# Patient Record
Sex: Female | Born: 1985 | Race: White | Hispanic: No | Marital: Married | Smoking: Never smoker
Health system: Southern US, Community
[De-identification: ages and names within clinical notes are randomized; demographics above are authoritative.]

## PROBLEM LIST (undated history)

## (undated) DIAGNOSIS — Z789 Other specified health status: Secondary | ICD-10-CM

## (undated) HISTORY — PX: TYMPANOSTOMY TUBE PLACEMENT: SHX32

## (undated) HISTORY — PX: TUBAL LIGATION: SHX77

---
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
## 2006-07-14 ENCOUNTER — Emergency Department (HOSPITAL_COMMUNITY): Admission: EM | Admit: 2006-07-14 | Discharge: 2006-07-14 | Payer: Self-pay | Admitting: Emergency Medicine

## 2007-12-31 ENCOUNTER — Inpatient Hospital Stay (HOSPITAL_COMMUNITY): Admission: AD | Admit: 2007-12-31 | Discharge: 2007-12-31 | Payer: Self-pay | Admitting: Obstetrics & Gynecology

## 2008-01-11 ENCOUNTER — Inpatient Hospital Stay (HOSPITAL_COMMUNITY): Admission: RE | Admit: 2008-01-11 | Discharge: 2008-01-13 | Payer: Self-pay | Admitting: Obstetrics and Gynecology

## 2011-04-09 LAB — CBC
HCT: 35.8 — ABNORMAL LOW
Hemoglobin: 12.2
MCHC: 34.1
Platelets: 209
RBC: 3.93
RDW: 13.3
WBC: 8.9

## 2011-04-09 LAB — COMPREHENSIVE METABOLIC PANEL
ALT: 18
AST: 25
Albumin: 2.3 — ABNORMAL LOW
Calcium: 8.7
GFR calc Af Amer: >60
Potassium: 4
Sodium: 136
Total Protein: 5 — ABNORMAL LOW

## 2011-04-09 LAB — URINALYSIS, DIPSTICK ONLY
Bilirubin Urine: NEGATIVE
Glucose, UA: NEGATIVE
Hgb urine dipstick: NEGATIVE
Ketones, ur: NEGATIVE
Leukocytes, UA: NEGATIVE
pH: 7.5

## 2011-04-09 LAB — RPR: RPR Ser Ql: NONREACTIVE

## 2011-04-09 LAB — LACTATE DEHYDROGENASE: LDH: 158

## 2011-05-13 ENCOUNTER — Other Ambulatory Visit: Payer: Self-pay

## 2011-05-13 ENCOUNTER — Ambulatory Visit
Admission: RE | Admit: 2011-05-13 | Discharge: 2011-05-13 | Disposition: A | Discharge status: Home / Self Care | Payer: Commercial Managed Care - PPO | Source: Ambulatory Visit | Attending: Family Medicine | Admitting: Family Medicine

## 2011-05-13 ENCOUNTER — Other Ambulatory Visit: Payer: Self-pay | Admitting: Family Medicine

## 2011-05-13 DIAGNOSIS — R112 Nausea with vomiting, unspecified: Secondary | ICD-10-CM

## 2012-04-09 IMAGING — US US ABDOMEN COMPLETE
1 series · 14 of 25 positions shown · non-contrast
Comparison: None.

Were *RADIOLOGY REPORT*
CLINICAL DATA: Abdominal pain, nausea vomiting.

COMPLETE ABDOMINAL ULTRASOUND

[Series 1: us abdomen complete · 0.22mm/px · 14 of 77 slices shown]
[im 1/77]
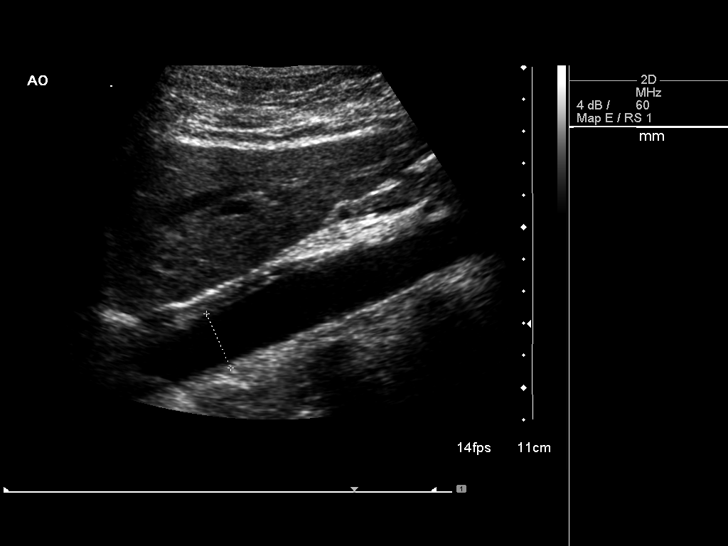
[im 7/77]
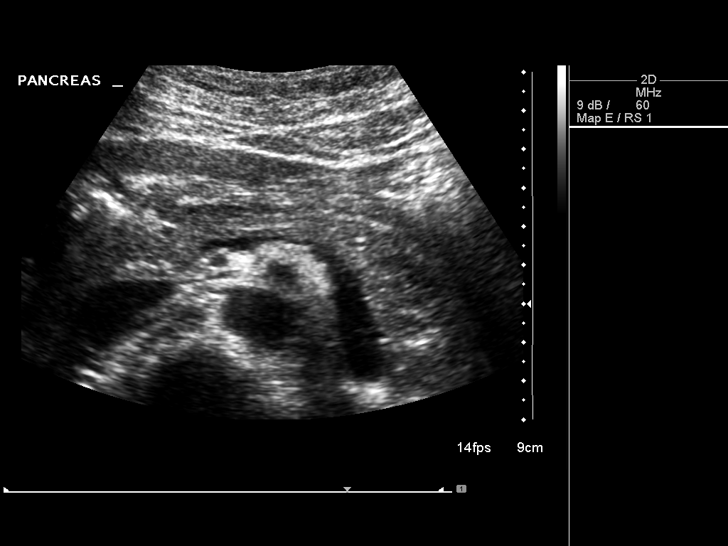
[im 13/77]
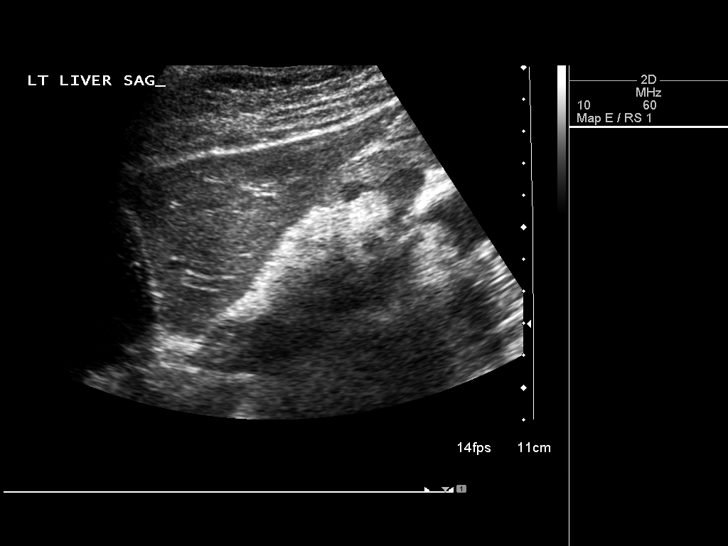
[im 20/77]
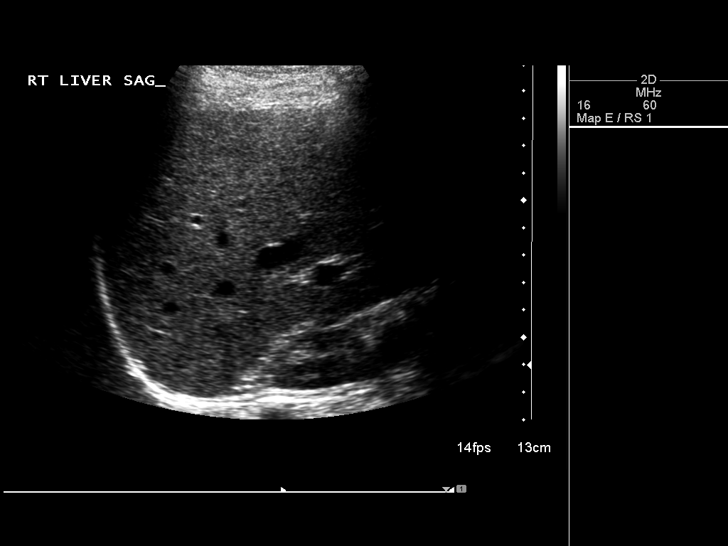
[im 26/77]
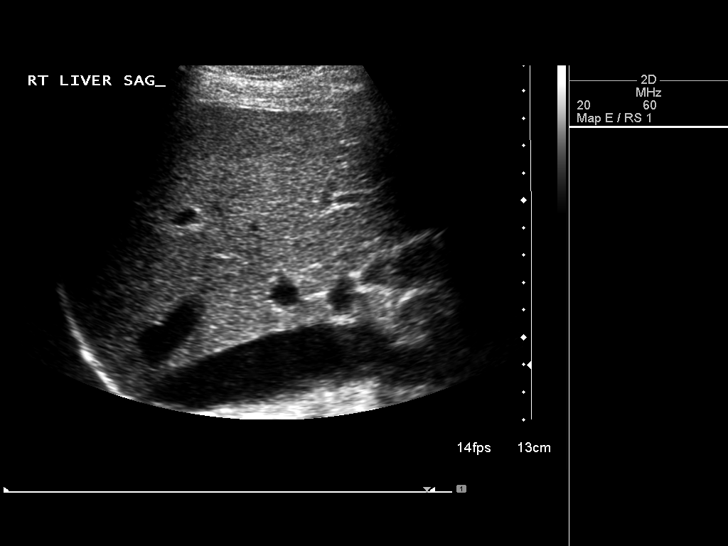
[im 29/77]
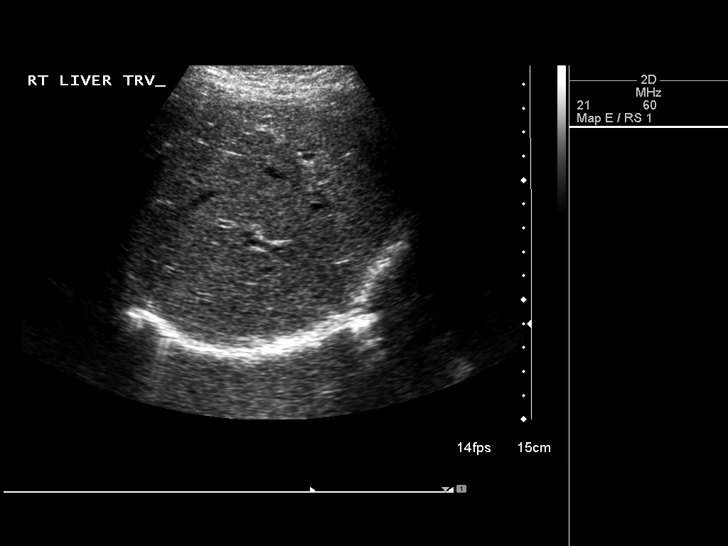
[im 35/77]
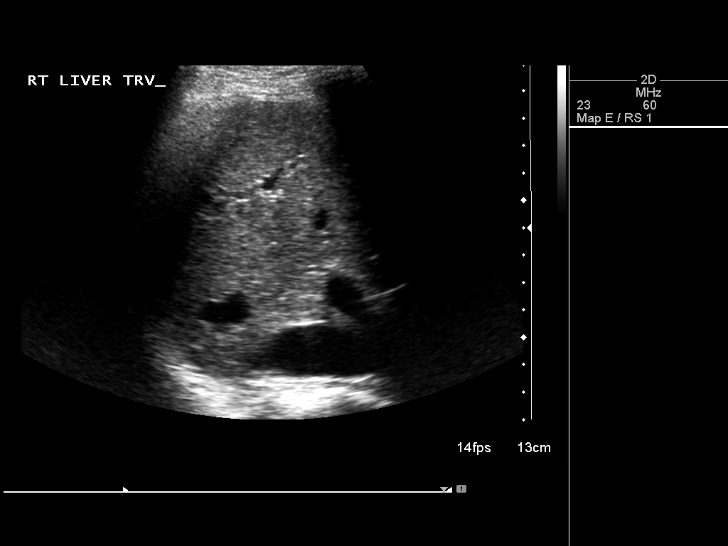
[im 42/77]
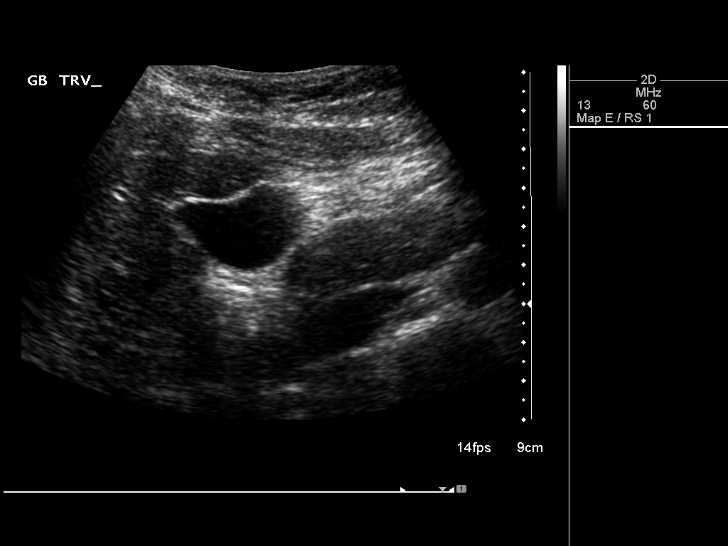
[im 48/77]
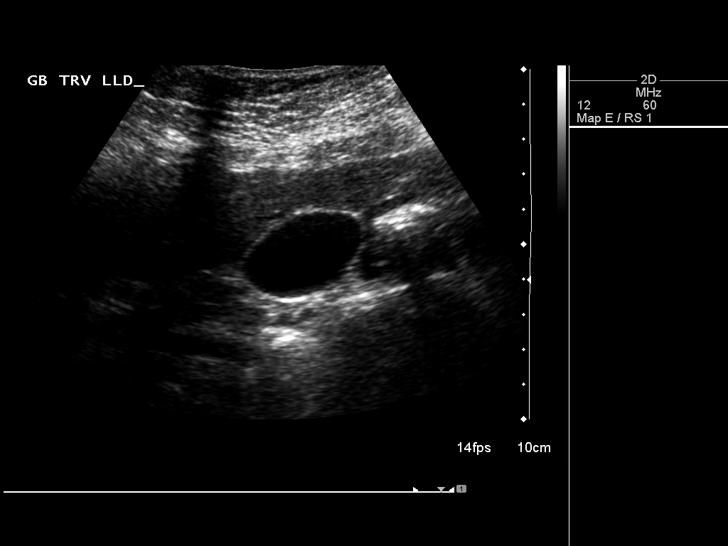
[im 51/77]
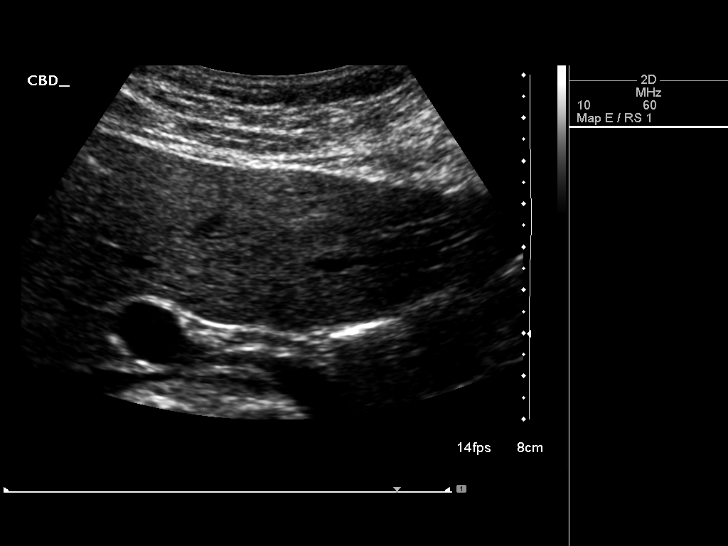
[im 58/77]
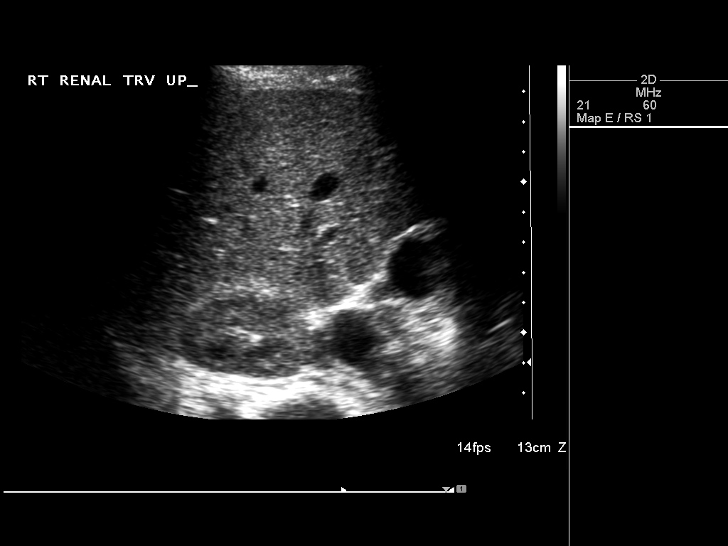
[im 64/77]
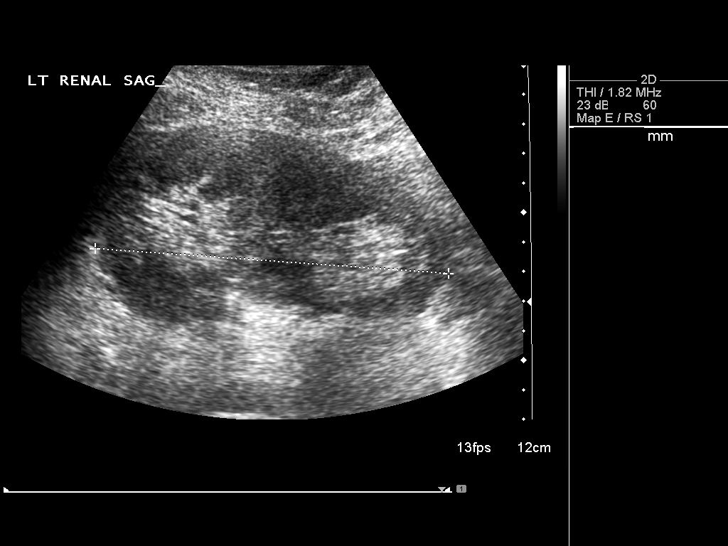
[im 70/77]
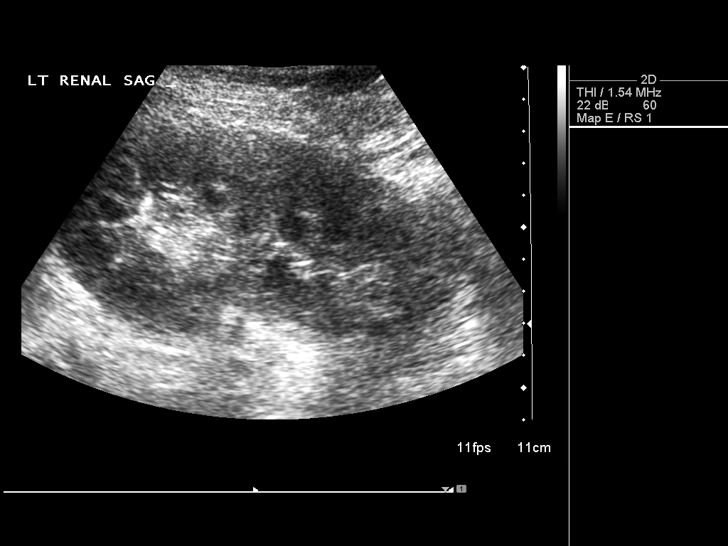
[im 77/77]
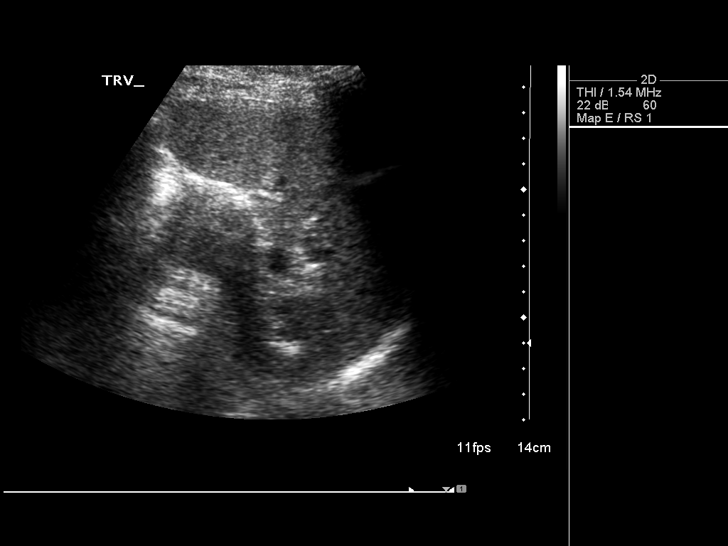

[14 of 25 positions shown; findings below may reference images not displayed]

FINDINGS: Gallbladder:  No gallstones, gallbladder wall thickening, or
pericholecystic fluid.

Common bile duct:  Normal in caliber measuring a maximum of 4.0mm.

Liver:  The liver is sonographically unremarkable.  There is normal
echogenicity without focal lesions or intrahepatic biliary
dilatation.

IVC:  Normal caliber.

Pancreas:  Sonographically unremarkable.

Spleen:  Normal size and echogenicity without focal lesions.

Right Kidney:  10.8 cm in length. Normal renal cortical thickness
and echogenicity without focal lesions or hydronephrosis.

Left Kidney:  12.4 cm in length. Normal renal cortical thickness
and echogenicity without focal lesions or hydronephrosis.

Abdominal aorta:  Normal caliber.
IMPRESSION: Unremarkable abdominal ultrasound examination.

## 2012-04-18 ENCOUNTER — Other Ambulatory Visit: Payer: Self-pay | Admitting: Family Medicine

## 2012-04-18 DIAGNOSIS — N63 Unspecified lump in unspecified breast: Secondary | ICD-10-CM

## 2012-04-20 ENCOUNTER — Other Ambulatory Visit: Payer: Commercial Managed Care - PPO

## 2012-04-27 ENCOUNTER — Ambulatory Visit
Admission: RE | Admit: 2012-04-27 | Discharge: 2012-04-27 | Disposition: A | Discharge status: Home / Self Care | Payer: Commercial Managed Care - PPO | Source: Ambulatory Visit | Attending: Family Medicine | Admitting: Family Medicine

## 2012-04-27 DIAGNOSIS — N63 Unspecified lump in unspecified breast: Secondary | ICD-10-CM

## 2013-06-06 ENCOUNTER — Ambulatory Visit (HOSPITAL_COMMUNITY)
Admission: RE | Admit: 2013-06-06 | Discharge: 2013-06-06 | Disposition: A | Discharge status: Home / Self Care | Payer: 59 | Source: Ambulatory Visit | Attending: Pediatrics | Admitting: Pediatrics

## 2013-06-06 DIAGNOSIS — O0281 Inappropriate change in quantitative human chorionic gonadotropin (hCG) in early pregnancy: Secondary | ICD-10-CM | POA: Insufficient documentation

## 2013-06-06 LAB — HCG, QUANTITATIVE, PREGNANCY: hCG, Beta Chain, Quant, S: 5852 m[IU]/mL — ABNORMAL HIGH (ref <5)

## 2013-07-13 NOTE — L&D Delivery Note (Signed)
Delivery Note At 3:43 PM a viable and healthy female was delivered via Vaginal, Spontaneous Delivery (Presentation: Left Occiput Anterior).  APGAR: 9, 9; weight  pending.   Placenta status: Intact, Spontaneous.  Cord: 3 vessels   Anesthesia: Epidural  Episiotomy: None Lacerations: 2nd degree;Perineal Suture Repair: 3.0 vicryl Est. Blood Loss (mL):  400 cc  Mom to postpartum.  Baby to Couplet care / Skin to Skin.  Huntley Knoop H. 06/11/2014, 4:06 PM

## 2013-09-21 ENCOUNTER — Encounter: Payer: Self-pay | Admitting: Podiatry

## 2013-09-21 ENCOUNTER — Ambulatory Visit (INDEPENDENT_AMBULATORY_CARE_PROVIDER_SITE_OTHER): Payer: 59

## 2013-09-21 ENCOUNTER — Ambulatory Visit (INDEPENDENT_AMBULATORY_CARE_PROVIDER_SITE_OTHER): Payer: 59 | Admitting: Podiatry

## 2013-09-21 VITALS — BP 109/76 | HR 108 | Resp 16 | Ht 65.0 in | Wt 153.0 lb

## 2013-09-21 DIAGNOSIS — L03039 Cellulitis of unspecified toe: Secondary | ICD-10-CM

## 2013-09-21 DIAGNOSIS — M722 Plantar fascial fibromatosis: Secondary | ICD-10-CM

## 2013-09-21 MED ORDER — TRIAMCINOLONE ACETONIDE 10 MG/ML IJ SUSP
10.0000 mg | Freq: Once | INTRAMUSCULAR | Status: AC
  Administered 2013-09-21: 10 mg

## 2013-09-21 NOTE — Patient Instructions (Signed)
Betadine Soak Instructions  Purchase an 8 oz. bottle of BETADINE solution (Povidone)  THE DAY AFTER THE PROCEDURE  Place 1 tablespoon of betadine solution in a quart of warm tap water.  Submerge your foot or feet with outer bandage intact for the initial soak; this will allow the bandage to become moist and wet for easy lift off.  Once you remove your bandage, continue to soak in the solution for 20 minutes.  This soak should be done twice a day.  Next, remove your foot or feet from solution, blot dry the affected area and cover.  You may use a band aid large enough to cover the area or use gauze and tape.  Apply other medications to the area as directed by the doctor such as cortisporin otic solution (ear drops) or neosporin.  IF YOUR SKIN BECOMES IRRITATED WHILE USING THESE INSTRUCTIONS, IT IS OKAY TO SWITCH TO EPSOM SALTS AND WATER OR WHITE VINEGAR AND WATER.   Long Term Care Instructions-Post Nail Surgery  You have had your ingrown toenail and root treated with a chemical.  This chemical causes a burn that will drain and ooze like a blister.  This can drain for 6-8 weeks or longer.  It is important to keep this area clean, covered, and follow the soaking instructions dispensed at the time of your surgery.  This area will eventually dry and form a scab.  Once the scab forms you no longer need to soak or apply a dressing.  If at any time you experience an increase in pain, redness, swelling, or drainage, you should contact the office as soon as possible.    Plantar Fasciitis (Heel Spur Syndrome) with Rehab The plantar fascia is a fibrous, ligament-like, soft-tissue structure that spans the bottom of the foot. Plantar fasciitis is a condition that causes pain in the foot due to inflammation of the tissue. SYMPTOMS   Pain and tenderness on the underneath side of the foot.  Pain that worsens with standing or walking. CAUSES  Plantar fasciitis is caused by irritation and injury to the plantar  fascia on the underneath side of the foot. Common mechanisms of injury include:  Direct trauma to bottom of the foot.  Damage to a small nerve that runs under the foot where the main fascia attaches to the heel bone.  Stress placed on the plantar fascia due to bone spurs. RISK INCREASES WITH:   Activities that place stress on the plantar fascia (running, jumping, pivoting, or cutting).  Poor strength and flexibility.  Improperly fitted shoes.  Tight calf muscles.  Flat feet.  Failure to warm-up properly before activity.  Obesity. PREVENTION  Warm up and stretch properly before activity.  Allow for adequate recovery between workouts.  Maintain physical fitness:  Strength, flexibility, and endurance.  Cardiovascular fitness.  Maintain a health body weight.  Avoid stress on the plantar fascia.  Wear properly fitted shoes, including arch supports for individuals who have flat feet.  PROGNOSIS  If treated properly, then the symptoms of plantar fasciitis usually resolve without surgery. However, occasionally surgery is necessary.  RELATED COMPLICATIONS   Recurrent symptoms that may result in a chronic condition.  Problems of the lower back that are caused by compensating for the injury, such as limping.  Pain or weakness of the foot during push-off following surgery.  Chronic inflammation, scarring, and partial or complete fascia tear, occurring more often from repeated injections.  TREATMENT  Treatment initially involves the use of ice and medication to   help reduce pain and inflammation. The use of strengthening and stretching exercises may help reduce pain with activity, especially stretches of the Achilles tendon. These exercises may be performed at home or with a therapist. Your caregiver may recommend that you use heel cups of arch supports to help reduce stress on the plantar fascia. Occasionally, corticosteroid injections are given to reduce inflammation. If  symptoms persist for greater than 6 months despite non-surgical (conservative), then surgery may be recommended.   MEDICATION   If pain medication is necessary, then nonsteroidal anti-inflammatory medications, such as aspirin and ibuprofen, or other minor pain relievers, such as acetaminophen, are often recommended.  Do not take pain medication within 7 days before surgery.  Prescription pain relievers may be given if deemed necessary by your caregiver. Use only as directed and only as much as you need.  Corticosteroid injections may be given by your caregiver. These injections should be reserved for the most serious cases, because they may only be given a certain number of times.  HEAT AND COLD  Cold treatment (icing) relieves pain and reduces inflammation. Cold treatment should be applied for 10 to 15 minutes every 2 to 3 hours for inflammation and pain and immediately after any activity that aggravates your symptoms. Use ice packs or massage the area with a piece of ice (ice massage).  Heat treatment may be used prior to performing the stretching and strengthening activities prescribed by your caregiver, physical therapist, or athletic trainer. Use a heat pack or soak the injury in warm water.  SEEK IMMEDIATE MEDICAL CARE IF:  Treatment seems to offer no benefit, or the condition worsens.  Any medications produce adverse side effects.  EXERCISES- RANGE OF MOTION (ROM) AND STRETCHING EXERCISES - Plantar Fasciitis (Heel Spur Syndrome) These exercises may help you when beginning to rehabilitate your injury. Your symptoms may resolve with or without further involvement from your physician, physical therapist or athletic trainer. While completing these exercises, remember:   Restoring tissue flexibility helps normal motion to return to the joints. This allows healthier, less painful movement and activity.  An effective stretch should be held for at least 30 seconds.  A stretch should  never be painful. You should only feel a gentle lengthening or release in the stretched tissue.  RANGE OF MOTION - Toe Extension, Flexion  Sit with your right / left leg crossed over your opposite knee.  Grasp your toes and gently pull them back toward the top of your foot. You should feel a stretch on the bottom of your toes and/or foot.  Hold this stretch for 10 seconds.  Now, gently pull your toes toward the bottom of your foot. You should feel a stretch on the top of your toes and or foot.  Hold this stretch for 10 seconds. Repeat  times. Complete this stretch 3 times per day.   RANGE OF MOTION - Ankle Dorsiflexion, Active Assisted  Remove shoes and sit on a chair that is preferably not on a carpeted surface.  Place right / left foot under knee. Extend your opposite leg for support.  Keeping your heel down, slide your right / left foot back toward the chair until you feel a stretch at your ankle or calf. If you do not feel a stretch, slide your bottom forward to the edge of the chair, while still keeping your heel down.  Hold this stretch for 10 seconds. Repeat 3 times. Complete this stretch 2 times per day.   STRETCH  Gastroc, Standing    Place hands on wall.  Extend right / left leg, keeping the front knee somewhat bent.  Slightly point your toes inward on your back foot.  Keeping your right / left heel on the floor and your knee straight, shift your weight toward the wall, not allowing your back to arch.  You should feel a gentle stretch in the right / left calf. Hold this position for 10 seconds. Repeat 3 times. Complete this stretch 2 times per day.  STRETCH  Soleus, Standing  Place hands on wall.  Extend right / left leg, keeping the other knee somewhat bent.  Slightly point your toes inward on your back foot.  Keep your right / left heel on the floor, bend your back knee, and slightly shift your weight over the back leg so that you feel a gentle stretch deep in  your back calf.  Hold this position for 10 seconds. Repeat 3 times. Complete this stretch 2 times per day.  STRETCH  Gastrocsoleus, Standing  Note: This exercise can place a lot of stress on your foot and ankle. Please complete this exercise only if specifically instructed by your caregiver.   Place the ball of your right / left foot on a step, keeping your other foot firmly on the same step.  Hold on to the wall or a rail for balance.  Slowly lift your other foot, allowing your body weight to press your heel down over the edge of the step.  You should feel a stretch in your right / left calf.  Hold this position for 10 seconds.  Repeat this exercise with a slight bend in your right / left knee. Repeat 3 times. Complete this stretch 2 times per day.   STRENGTHENING EXERCISES - Plantar Fasciitis (Heel Spur Syndrome)  These exercises may help you when beginning to rehabilitate your injury. They may resolve your symptoms with or without further involvement from your physician, physical therapist or athletic trainer. While completing these exercises, remember:   Muscles can gain both the endurance and the strength needed for everyday activities through controlled exercises.  Complete these exercises as instructed by your physician, physical therapist or athletic trainer. Progress the resistance and repetitions only as guided.  STRENGTH - Towel Curls  Sit in a chair positioned on a non-carpeted surface.  Place your foot on a towel, keeping your heel on the floor.  Pull the towel toward your heel by only curling your toes. Keep your heel on the floor. Repeat 3 times. Complete this exercise 2 times per day.  STRENGTH - Ankle Inversion  Secure one end of a rubber exercise band/tubing to a fixed object (table, pole). Loop the other end around your foot just before your toes.  Place your fists between your knees. This will focus your strengthening at your ankle.  Slowly, pull your  big toe up and in, making sure the band/tubing is positioned to resist the entire motion.  Hold this position for 10 seconds.  Have your muscles resist the band/tubing as it slowly pulls your foot back to the starting position. Repeat 3 times. Complete this exercises 2 times per day.  Document Released: 06/29/2005 Document Revised: 09/21/2011 Document Reviewed: 10/11/2008 [employer] Patient Information 2014 [employer].   

## 2013-09-21 NOTE — Progress Notes (Signed)
   Subjective:    Patient ID: Jaime Frank, female    DOB: 02/11/1986, 28 y.o.   MRN: [id number]  HPI Comments: "I have some toenail problems and my heel hurts"  Patient c/o 1st toes bilateral, right-lateral border and left-medial border for a few weeks. The area are red and swollen. Right worse than left. She has been soaking in epsom and using neosporin. She also started back on a leftover antibiotic she had. Some better.  Also, patient c/o aching plantar heel left for a few months. Does have AM pain. Dankso clogs make worse. She has been trying sneakers.  Foot Pain      Review of Systems  All other systems reviewed and are negative.       Objective:   Physical Exam        Assessment & Plan:

## 2013-09-23 NOTE — Progress Notes (Signed)
Subjective:     Patient ID: Jaime Frank, female   DOB: 04/19/1986, 28 y.o.   MRN: [id number]  Foot Pain   patient presents with pain in the left plantar fascia that has been going on for several months and he rotated nailbed right lateral left medial that had been red and sore when pressed with no history of injury or tight shoes   Review of Systems  All other systems reviewed and are negative.       Objective:   Physical Exam  Nursing note and vitals reviewed. Constitutional: She is oriented to person, place, and time.  Cardiovascular: Intact distal pulses.   Musculoskeletal: Normal range of motion.  Neurological: She is oriented to person, place, and time.  Skin: Skin is warm.   neurovascular status intact with muscle strength adequate and no equinus and range of motion within normal limits. Discomfort of a significant nature plantar heel left at the insertional point of the tendon into the calcaneus and redness of the localized nature right lateral border left medial border with soreness when pressed     Assessment:     Paronychia infection of each big toe and plantar fascial pain left    Plan:     H&P and x-rays reviewed and recommended removal of the nail corners. I infiltrated each hallux 60 mg Xylocaine Marcaine mixture remove the corners and proud flesh and allowed drainage to occur and then applied sterile dressing. Injected the left plantar fascia 3 a Kenalog 5 mg Xylocaine Marcaine mixture and applied fascially brace for support and gave instructions on reduced activity for the next several days

## 2013-10-05 ENCOUNTER — Ambulatory Visit: Payer: 59 | Admitting: Podiatry

## 2013-12-05 LAB — OB RESULTS CONSOLE GC/CHLAMYDIA
Chlamydia: NEGATIVE
GC PROBE AMP, GENITAL: NEGATIVE

## 2013-12-05 LAB — OB RESULTS CONSOLE ABO/RH: RH TYPE: POSITIVE

## 2013-12-05 LAB — OB RESULTS CONSOLE ANTIBODY SCREEN: ANTIBODY SCREEN: NEGATIVE

## 2013-12-05 LAB — OB RESULTS CONSOLE HIV ANTIBODY (ROUTINE TESTING): HIV: NONREACTIVE

## 2013-12-05 LAB — OB RESULTS CONSOLE RPR: RPR: NONREACTIVE

## 2013-12-05 LAB — OB RESULTS CONSOLE RUBELLA ANTIBODY, IGM: RUBELLA: IMMUNE

## 2013-12-05 LAB — OB RESULTS CONSOLE HEPATITIS B SURFACE ANTIGEN: Hepatitis B Surface Ag: NEGATIVE

## 2014-05-21 LAB — OB RESULTS CONSOLE GBS: GBS: NEGATIVE

## 2014-06-11 ENCOUNTER — Encounter (HOSPITAL_COMMUNITY): Payer: Self-pay

## 2014-06-11 ENCOUNTER — Inpatient Hospital Stay (HOSPITAL_COMMUNITY): Payer: 59 | Admitting: Anesthesiology

## 2014-06-11 ENCOUNTER — Inpatient Hospital Stay (HOSPITAL_COMMUNITY)
Admission: AD | Admit: 2014-06-11 | Discharge: 2014-06-13 | DRG: 775 | Disposition: A | Discharge status: Home / Self Care | Payer: 59 | Source: Ambulatory Visit | Attending: Obstetrics and Gynecology | Admitting: Obstetrics and Gynecology

## 2014-06-11 ENCOUNTER — Encounter (HOSPITAL_COMMUNITY): Payer: Self-pay | Admitting: *Deleted

## 2014-06-11 DIAGNOSIS — Z3A39 39 weeks gestation of pregnancy: Secondary | ICD-10-CM | POA: Diagnosis present

## 2014-06-11 DIAGNOSIS — Z3483 Encounter for supervision of other normal pregnancy, third trimester: Secondary | ICD-10-CM | POA: Diagnosis present

## 2014-06-11 DIAGNOSIS — Z331 Pregnant state, incidental: Secondary | ICD-10-CM

## 2014-06-11 HISTORY — DX: Other specified health status: Z78.9

## 2014-06-11 LAB — TYPE AND SCREEN
ABO/RH(D): A POS
Antibody Screen: NEGATIVE

## 2014-06-11 LAB — CBC
HCT: 37.2 % (ref 36.0–46.0)
Hemoglobin: 12.2 g/dL (ref 12.0–15.0)
MCH: 29 pg (ref 26.0–34.0)
MCHC: 32.8 g/dL (ref 30.0–36.0)
MCV: 88.4 fL (ref 78.0–100.0)
PLATELETS: 206 10*3/uL (ref 150–400)
RBC: 4.21 MIL/uL (ref 3.87–5.11)
RDW: 13.6 % (ref 11.5–15.5)
WBC: 10.2 10*3/uL (ref 4.0–10.5)

## 2014-06-11 LAB — RPR

## 2014-06-11 MED ORDER — OXYTOCIN 40 UNITS IN LACTATED RINGERS INFUSION - SIMPLE MED: 62.5000 mL/h | INTRAVENOUS | Status: DC

## 2014-06-11 MED ORDER — SENNOSIDES-DOCUSATE SODIUM 8.6-50 MG PO TABS
2.0000 | ORAL_TABLET | ORAL | Status: DC
  Administered 2014-06-11 – 2014-06-12 (×2): 2 via ORAL
  Filled 2014-06-11 (×2): qty 2

## 2014-06-11 MED ORDER — DIPHENHYDRAMINE HCL 25 MG PO CAPS: 25.0000 mg | ORAL_CAPSULE | Freq: Four times a day (QID) | ORAL | Status: DC | PRN

## 2014-06-11 MED ORDER — METHYLERGONOVINE MALEATE 0.2 MG PO TABS: 0.2000 mg | ORAL_TABLET | ORAL | Status: DC | PRN

## 2014-06-11 MED ORDER — OXYCODONE-ACETAMINOPHEN 5-325 MG PO TABS: 2.0000 | ORAL_TABLET | ORAL | Status: DC | PRN

## 2014-06-11 MED ORDER — EPHEDRINE 5 MG/ML INJ
10.0000 mg | INTRAVENOUS | Status: DC | PRN
  Filled 2014-06-11: qty 2

## 2014-06-11 MED ORDER — TETANUS-DIPHTH-ACELL PERTUSSIS 5-2.5-18.5 LF-MCG/0.5 IM SUSP
0.5000 mL | Freq: Once | INTRAMUSCULAR | Status: AC
  Administered 2014-06-12: 0.5 mL via INTRAMUSCULAR
  Filled 2014-06-11: qty 0.5

## 2014-06-11 MED ORDER — OXYCODONE-ACETAMINOPHEN 5-325 MG PO TABS: 1.0000 | ORAL_TABLET | ORAL | Status: DC | PRN

## 2014-06-11 MED ORDER — DIBUCAINE 1 % RE OINT: 1.0000 "application " | TOPICAL_OINTMENT | RECTAL | Status: DC | PRN

## 2014-06-11 MED ORDER — FENTANYL 2.5 MCG/ML BUPIVACAINE 1/10 % EPIDURAL INFUSION (WH - ANES)
14.0000 mL/h | INTRAMUSCULAR | Status: DC | PRN
  Administered 2014-06-11: 14 mL/h via EPIDURAL

## 2014-06-11 MED ORDER — PHENYLEPHRINE 40 MCG/ML (10ML) SYRINGE FOR IV PUSH (FOR BLOOD PRESSURE SUPPORT)
80.0000 ug | PREFILLED_SYRINGE | INTRAVENOUS | Status: DC | PRN
  Filled 2014-06-11: qty 2

## 2014-06-11 MED ORDER — FENTANYL 2.5 MCG/ML BUPIVACAINE 1/10 % EPIDURAL INFUSION (WH - ANES): 14.0000 mL/h | INTRAMUSCULAR | Status: DC | PRN

## 2014-06-11 MED ORDER — PHENYLEPHRINE 40 MCG/ML (10ML) SYRINGE FOR IV PUSH (FOR BLOOD PRESSURE SUPPORT)
PREFILLED_SYRINGE | INTRAVENOUS | Status: AC
  Filled 2014-06-11: qty 10

## 2014-06-11 MED ORDER — FLEET ENEMA 7-19 GM/118ML RE ENEM: 1.0000 | ENEMA | Freq: Every day | RECTAL | Status: DC | PRN

## 2014-06-11 MED ORDER — ONDANSETRON HCL 4 MG PO TABS: 4.0000 mg | ORAL_TABLET | ORAL | Status: DC | PRN

## 2014-06-11 MED ORDER — CITRIC ACID-SODIUM CITRATE 334-500 MG/5ML PO SOLN: 30.0000 mL | ORAL | Status: DC | PRN

## 2014-06-11 MED ORDER — LACTATED RINGERS IV SOLN
500.0000 mL | INTRAVENOUS | Status: DC | PRN
  Administered 2014-06-11: 500 mL via INTRAVENOUS

## 2014-06-11 MED ORDER — PRENATAL MULTIVITAMIN CH
1.0000 | ORAL_TABLET | Freq: Every day | ORAL | Status: DC
  Administered 2014-06-12 – 2014-06-13 (×2): 1 via ORAL
  Filled 2014-06-11 (×2): qty 1

## 2014-06-11 MED ORDER — ONDANSETRON HCL 4 MG/2ML IJ SOLN: 4.0000 mg | INTRAMUSCULAR | Status: DC | PRN

## 2014-06-11 MED ORDER — WITCH HAZEL-GLYCERIN EX PADS: 1.0000 "application " | MEDICATED_PAD | CUTANEOUS | Status: DC | PRN

## 2014-06-11 MED ORDER — OXYTOCIN 40 UNITS IN LACTATED RINGERS INFUSION - SIMPLE MED
1.0000 m[IU]/min | INTRAVENOUS | Status: DC
  Administered 2014-06-11: 2 m[IU]/min via INTRAVENOUS

## 2014-06-11 MED ORDER — OXYCODONE-ACETAMINOPHEN 5-325 MG PO TABS
1.0000 | ORAL_TABLET | ORAL | Status: DC | PRN
  Administered 2014-06-12: 1 via ORAL
  Filled 2014-06-11: qty 1

## 2014-06-11 MED ORDER — TERBUTALINE SULFATE 1 MG/ML IJ SOLN: 0.2500 mg | Freq: Once | INTRAMUSCULAR | Status: DC | PRN

## 2014-06-11 MED ORDER — OXYTOCIN BOLUS FROM INFUSION: 500.0000 mL | INTRAVENOUS | Status: DC

## 2014-06-11 MED ORDER — LACTATED RINGERS IV SOLN
500.0000 mL | Freq: Once | INTRAVENOUS | Status: AC
  Administered 2014-06-11: 500 mL via INTRAVENOUS

## 2014-06-11 MED ORDER — LANOLIN HYDROUS EX OINT: TOPICAL_OINTMENT | CUTANEOUS | Status: DC | PRN

## 2014-06-11 MED ORDER — DIPHENHYDRAMINE HCL 50 MG/ML IJ SOLN: 12.5000 mg | INTRAMUSCULAR | Status: DC | PRN

## 2014-06-11 MED ORDER — BENZOCAINE-MENTHOL 20-0.5 % EX AERO
1.0000 "application " | INHALATION_SPRAY | CUTANEOUS | Status: DC | PRN
  Administered 2014-06-11: 1 via TOPICAL
  Filled 2014-06-11: qty 56

## 2014-06-11 MED ORDER — IBUPROFEN 600 MG PO TABS
600.0000 mg | ORAL_TABLET | Freq: Four times a day (QID) | ORAL | Status: DC
  Administered 2014-06-11 – 2014-06-13 (×8): 600 mg via ORAL
  Filled 2014-06-11 (×8): qty 1

## 2014-06-11 MED ORDER — LIDOCAINE HCL (PF) 1 % IJ SOLN
30.0000 mL | INTRAMUSCULAR | Status: DC | PRN
  Filled 2014-06-11: qty 30

## 2014-06-11 MED ORDER — SIMETHICONE 80 MG PO CHEW: 80.0000 mg | CHEWABLE_TABLET | ORAL | Status: DC | PRN

## 2014-06-11 MED ORDER — ZOLPIDEM TARTRATE 5 MG PO TABS: 5.0000 mg | ORAL_TABLET | Freq: Every evening | ORAL | Status: DC | PRN

## 2014-06-11 MED ORDER — LACTATED RINGERS IV SOLN
INTRAVENOUS | Status: DC
  Administered 2014-06-11: 12:00:00 via INTRAVENOUS

## 2014-06-11 MED ORDER — FENTANYL 2.5 MCG/ML BUPIVACAINE 1/10 % EPIDURAL INFUSION (WH - ANES)
INTRAMUSCULAR | Status: DC
Start: 2014-06-11 — End: 2014-06-11
  Filled 2014-06-11: qty 125

## 2014-06-11 MED ORDER — ACETAMINOPHEN 325 MG PO TABS: 650.0000 mg | ORAL_TABLET | ORAL | Status: DC | PRN

## 2014-06-11 MED ORDER — LIDOCAINE HCL (PF) 1 % IJ SOLN
INTRAMUSCULAR | Status: DC | PRN
  Administered 2014-06-11: 6 mL
  Administered 2014-06-11: 4 mL

## 2014-06-11 MED ORDER — METHYLERGONOVINE MALEATE 0.2 MG/ML IJ SOLN: 0.2000 mg | INTRAMUSCULAR | Status: DC | PRN

## 2014-06-11 MED ORDER — ONDANSETRON HCL 4 MG/2ML IJ SOLN: 4.0000 mg | Freq: Four times a day (QID) | INTRAMUSCULAR | Status: DC | PRN

## 2014-06-11 NOTE — Anesthesia Preprocedure Evaluation (Signed)

## 2014-06-11 NOTE — Anesthesia Procedure Notes (Signed)

## 2014-06-11 NOTE — H&P (Signed)
Jaime MeadJessica Erler is a 28 y.o. female presenting for induction of labor  7127 you G2P1001 @ 39+4 presents for elective induction pf labor. He has been uncomplicated to this point  History OB History    Gravida Para Term Preterm AB TAB SAB Ectopic Multiple Living   2 1 1       1      Past Medical History  Diagnosis Date  . Medical history non-contributory    Past Surgical History  Procedure Laterality Date  . No past surgeries     Family History: family history is not on file. Social History:  reports that she has never smoked. She has never used smokeless tobacco. She reports that she does not drink alcohol or use illicit drugs.   Prenatal Transfer Tool  Maternal Diabetes: No 1 hour Glucola 78 Genetic Screening: Declined Maternal Ultrasounds/Referrals: Normal Fetal Ultrasounds or other Referrals:  None Maternal Substance Abuse:  No Significant Maternal Medications:  None Significant Maternal Lab Results:  None Other Comments:  None  ROS: as above  Dilation: 3.5 Effacement (%): 70 Station: -2 Exam by:: Dr.Bradan Congrove Blood pressure 108/81, pulse 80, temperature 98 F (36.7 C), temperature source Oral, resp. rate 18, height 5\' 5"  (1.651 m), weight 88.905 kg (196 lb), SpO2 99 %. Exam Physical Exam  Prenatal labs: ABO, Rh: A/Positive/-- (05/26 0000) Antibody: Negative (05/26 0000) Rubella: Immune (05/26 0000) RPR: Nonreactive (05/26 0000)  HBsAg: Negative (05/26 0000)  HIV: Non-reactive (05/26 0000)  GBS: Negative (11/09 0000)   Assessment/Plan: 1) Admit 2) Pitocin 3) Epidural on request 4) AROM when able   Genni Buske H. 06/11/2014, 11:11 AM

## 2014-06-11 NOTE — Plan of Care (Signed)
Problem: Consults Goal: Skin Care Protocol Initiated - if Braden Score 18 or less If consults are not indicated, leave blank or document N/A  Outcome: Not Applicable Date Met:  46/28/63  Problem: Phase I Progression Outcomes Goal: Pain controlled with appropriate interventions Outcome: Completed/Met Date Met:  06/11/14 Goal: Voiding adequately Outcome: Completed/Met Date Met:  06/11/14 Goal: OOB as tolerated unless otherwise ordered Outcome: Completed/Met Date Met:  06/11/14 Goal: VS, stable, temp < 100.4 degrees F Outcome: Completed/Met Date Met:  06/11/14 Goal: Other Phase I Outcomes/Goals Outcome: Completed/Met Date Met:  06/11/14

## 2014-06-11 NOTE — Plan of Care (Signed)
Problem: Phase I Progression Outcomes Goal: Initial discharge plan identified Outcome: Completed/Met Date Met:  06/11/14  Problem: Phase II Progression Outcomes Goal: Incision intact & without signs/symptoms of infection Outcome: Not Applicable Date Met:  67/22/77 Goal: Tolerating diet Outcome: Completed/Met Date Met:  06/11/14

## 2014-06-11 NOTE — Lactation Note (Signed)
This note was copied from the chart of Boy Natasha MeadJessica Varnadore. Lactation Consultation Note  P2, First time BF. Baby latched in football hold upon entering the room.  Baby unlatched and mother's nipple was compressed. Assisted in re-latching baby on with more depth.  Sucks and swallows observed.  At the end of the feeding mother's nipple was round. Demonstrated how to unlatch baby and massage to keep him active. Baby has been cueing often.  Discussed cluster feeding and encouraged STS. Mom encouraged to feed baby 8-12 times/24 hours and with feeding cues.  Mom made aware of O/P services, breastfeeding support groups, community resources, and our phone # for post-discharge questions.    Patient Name: Boy Natasha MeadJessica Gannett [id number]'s Date: 06/11/2014 Reason for consult: Initial assessment   Maternal Data Has patient been taught Hand Expression?: Yes Does the patient have breastfeeding experience prior to this delivery?: No  Feeding Feeding Type: Breast Fed (latched upon entering the room) Length of feed: 25 min  LATCH Score/Interventions Latch: Grasps breast easily, tongue down, lips flanged, rhythmical sucking. Intervention(s): Breast massage  Audible Swallowing: Spontaneous and intermittent  Type of Nipple: Everted at rest and after stimulation  Comfort (Breast/Nipple): Soft / non-tender     Hold (Positioning): Assistance needed to correctly position infant at breast and maintain latch.  LATCH Score: 9  Lactation Tools Discussed/Used     Consult Status Consult Status: Follow-up Date: 06/12/14 Follow-up type: In-patient    Dahlia ByesBerkelhammer, [facility] DivisionBos[facility] 06/11/2014, 10:35 PM

## 2014-06-12 ENCOUNTER — Encounter (HOSPITAL_COMMUNITY): Payer: Self-pay | Admitting: Anesthesiology

## 2014-06-12 LAB — CBC
HCT: 32.8 % — ABNORMAL LOW (ref 36.0–46.0)
HEMOGLOBIN: 11 g/dL — AB (ref 12.0–15.0)
MCH: 29.7 pg (ref 26.0–34.0)
MCHC: 33.5 g/dL (ref 30.0–36.0)
MCV: 88.6 fL (ref 78.0–100.0)
Platelets: 225 10*3/uL (ref 150–400)
RBC: 3.7 MIL/uL — ABNORMAL LOW (ref 3.87–5.11)
RDW: 13.6 % (ref 11.5–15.5)
WBC: 15.7 10*3/uL — ABNORMAL HIGH (ref 4.0–10.5)

## 2014-06-12 NOTE — Plan of Care (Signed)
Problem: Phase II Progression Outcomes Goal: Pain controlled on oral analgesia Outcome: Completed/Met Date Met:  06/12/14 Goal: Progress activity as tolerated unless otherwise ordered Outcome: Completed/Met Date Met:  06/12/14 Goal: Afebrile, VS remain stable Outcome: Completed/Met Date Met:  06/12/14 Goal: Other Phase II Outcomes/Goals Outcome: Not Applicable Date Met:  64/38/38

## 2014-06-12 NOTE — Anesthesia Postprocedure Evaluation (Deleted)
  Anesthesia Post-op Note  Patient: Natasha MeadJessica Porcher  Procedure(s) Performed: * No procedures listed *  Patient Location: Mother/Baby  Anesthesia Type:Epidural  Level of Consciousness: awake, alert  and oriented  Airway and Oxygen Therapy: Patient Spontanous Breathing  Post-op Pain: none  Post-op Assessment: Post-op Vital signs reviewed and Patient's Cardiovascular Status Stable  Post-op Vital Signs: Reviewed and stable  Last Vitals:  Filed Vitals:   06/12/14 0530  BP: 102/60  Pulse: 90  Temp: 36.4 C  Resp: 18    Complications: No apparent anesthesia complications

## 2014-06-12 NOTE — Plan of Care (Signed)
Problem: Consults Goal: Postpartum Patient Education (See Patient Education module for education specifics.)  Outcome: Completed/Met Date Met:  06/12/14     

## 2014-06-12 NOTE — Progress Notes (Signed)
Post Partum Day 1 Subjective: no complaints, up ad lib, voiding, tolerating PO and + flatus  Objective: Blood pressure 102/60, pulse 90, temperature 97.6 F (36.4 C), temperature source Oral, resp. rate 18, height 5\' 5"  (1.651 m), weight 88.905 kg (196 lb), SpO2 97 %, unknown if currently breastfeeding.  Physical Exam:  General: alert, cooperative and no distress Lochia: appropriate Uterine Fundus: firm perineum: healing well DVT Evaluation: No evidence of DVT seen on physical exam.   Recent Labs  06/11/14 0820 06/12/14 0611  HGB 12.2 11.0*  HCT 37.2 32.8*    Assessment/Plan: Plan for discharge tomorrow, Breastfeeding and Circumcision prior to discharge   LOS: 1 day   Gardenia Witter STACIA 06/12/2014, 8:58 AM

## 2014-06-12 NOTE — Lactation Note (Signed)
This note was copied from the chart of Boy Natasha MeadJessica Beirne. Lactation Consultation Note  Mom's nipples are getting sore related to frequent feedings and shallow latch.  Easton latches and suckles a few times and falls asleep.  She says it feels like he is sucking on a pacifier.  Suck evaluation reveals little suction even when finger is at the hard and soft palate juncture.  He lateralizes and extends his tongue well but is having difficulty with elevation.  He also is not able to keep his upper lip flanged and has a wide labial frenum that inserts close to the alveolar ridge.  A lingual frenum is seen a few mm from the tip of the tongue.  Easton's appetite is increasing .  A number 24 NS was attempted to see if it would help with seal on the breast but it did not.  Double electric breast pump was initiated in an attempt to express BM but none was expressed.  Discussed hydrolized formula with parents and they were agreeable to try finger feeding.  He allowed my finger into his mouth but the formula had to be pushed into his mouth.  He was content after 7ml.  Plan is to keep baby familiar with the breast but to supplement with finger feeding.  Mom will post-pump for 15 minutes 8-10 times in  24 hours.  Patient Name: Boy Natasha MeadJessica Somera [id number]'s Date: 06/12/2014 Reason for consult: Follow-up assessment   Maternal Data Has patient been taught Hand Expression?: Yes  Feeding Feeding Type: Formula  LATCH Score/Interventions Latch: Repeated attempts needed to sustain latch, nipple held in mouth throughout feeding, stimulation needed to elicit sucking reflex.  Audible Swallowing: None  Type of Nipple: Everted at rest and after stimulation  Comfort (Breast/Nipple): Filling, red/small blisters or bruises, mild/mod discomfort     Hold (Positioning): No assistance needed to correctly position infant at breast. Intervention(s): Breastfeeding basics reviewed;Support Pillows;Position options;Skin to  skin  LATCH Score: 6  Lactation Tools Discussed/Used     Consult Status Consult Status: Follow-up Date: 06/13/14 Follow-up type: In-patient    Soyla DryerJoseph, Berlyn Malina 06/12/2014, 8:10 PM

## 2014-06-13 NOTE — Discharge Summary (Signed)
Obstetric Discharge Summary Reason for Admission: induction of labor Prenatal Procedures: ultrasound Intrapartum Procedures: spontaneous vaginal delivery Postpartum Procedures: none Complications-Operative and Postpartum: 2nd degree perineal laceration HEMOGLOBIN  Date Value Ref Range Status  06/12/2014 11.0* 12.0 - 15.0 g/dL Final   HCT  Date Value Ref Range Status  06/12/2014 32.8* 36.0 - 46.0 % Final    Physical Exam:  General: alert Lochia: appropriate Uterine Fundus: firm   Discharge Diagnoses: Term Pregnancy-delivered  Discharge Information: Date: 06/13/2014 Activity: pelvic rest Diet: routine Medications: PNV and Ibuprofen Condition: stable Instructions: refer to practice specific booklet Discharge to: home Follow-up Information    Follow up with Almon HerculesOSS,KENDRA H., MD. Schedule an appointment as soon as possible for a visit in 1 month.   Specialty:  Obstetrics and Gynecology   Contact information:   [address] [place] [id number] [phone]       Newborn Data: Live born female  Birth Weight: 8 lb 13 oz (3997 g) APGAR: 9, 9  Home with mother.  Jaime Frank 06/13/2014, 10:43 AM

## 2014-06-13 NOTE — Plan of Care (Signed)
Problem: Discharge Progression Outcomes Goal: Barriers To Progression Addressed/Resolved Outcome: Completed/Met Date Met:  58/72/76 Goal: Complications resolved/controlled Outcome: Not Applicable Date Met:  18/48/59 Goal: Afebrile, VS remain stable at discharge Outcome: Completed/Met Date Met:  06/13/14 Goal: Discharge plan in place and appropriate Outcome: Completed/Met Date Met:  06/13/14 Goal: Other Discharge Outcomes/Goals Outcome: Not Applicable Date Met:  27/63/94

## 2014-06-13 NOTE — Plan of Care (Signed)
Problem: Discharge Progression Outcomes Goal: Activity appropriate for discharge plan Outcome: Completed/Met Date Met:  06/13/14 Goal: Tolerating diet Outcome: Completed/Met Date Met:  06/13/14 Goal: Pain controlled with appropriate interventions Outcome: Completed/Met Date Met:  06/13/14

## 2014-06-13 NOTE — Progress Notes (Signed)
PPD#2 Pt doing well. She desires to go home VSSAF IMP/ Doing well Plan/ Will discharge.

## 2014-06-13 NOTE — Lactation Note (Signed)
This note was copied from the chart of [name]. Lactation Consultation Note  Mother wants to breastfeed.  Discussed options and designed feeding plan for discharge with parent's input. Suggest weighing baby before and after a feeding to see if she is transferring any breastmilk. Baby sleepy and was recently fed at 0730 so considered this weight a snack. Reviewed cross cradle and football position.  Baby latched better in football position. After 8 min (baby fell asleep) baby transferred 5 ml. Mother also states she feels syringe feeding is cumbersome. Plan is mother will breastfeed baby first, massaging  her breasts to keep him active during feeding. Then if baby is still cueing after breastfeeding, she will supplement with formula based on volume guidelines. Parents would prefer a slow flow nipple. She will post pump at least 4-6 times a day for 15-20 min with DEBP both breasts. Give baby back volume pumped at next feeding. Supplement the difference with formula. Suggest she hand express before and after each pumping session. Reviewed engorgement care and cluster feeding. Provided extra set of comfort gels for tender nipples and apply ebm. Offered outpatient appt.  Mother prefers to call at a later date.    Mother really wants to breastfeed and feels syringe feeding is cumbersome  Patient Name: [name] ZOXWR'UToday's Date: 06/13/2014 Reason for consult: Follow-up assessment   Maternal Data    Feeding Feeding Type: Breast Fed Length of feed: 10 min  LATCH Score/Interventions Latch: Grasps breast easily, tongue down, lips flanged, rhythmical sucking. Intervention(s): Adjust position;Assist with latch;Breast massage  Audible Swallowing: A few with stimulation Intervention(s): Alternate breast massage;Hand expression  Type of Nipple: Everted at rest and after stimulation  Comfort (Breast/Nipple): Filling, red/small blisters or bruises, mild/mod  discomfort  Problem noted: Mild/Moderate discomfort Interventions (Mild/moderate discomfort): Comfort gels;Hand expression  Hold (Positioning): Assistance needed to correctly position infant at breast and maintain latch. Intervention(s): Position options;Skin to skin  LATCH Score: 7  Lactation Tools Discussed/Used     Consult Status Consult Status: Complete    Jaime Frank 06/13/2014, 9:22 AM

## 2014-07-02 NOTE — Addendum Note (Signed)
Addendum  created 07/02/14 1023 by Fabienne BrunsWendy M Misako Roeder, RN   Modules edited: Anesthesia Procedure Navigator Section

## 2015-05-21 LAB — OB RESULTS CONSOLE GC/CHLAMYDIA
Chlamydia: NEGATIVE
Gonorrhea: NEGATIVE

## 2015-05-21 LAB — OB RESULTS CONSOLE RUBELLA ANTIBODY, IGM: Rubella: NON-IMMUNE/NOT IMMUNE

## 2015-05-21 LAB — OB RESULTS CONSOLE ANTIBODY SCREEN: Antibody Screen: NEGATIVE

## 2015-05-21 LAB — OB RESULTS CONSOLE ABO/RH: RH TYPE: POSITIVE

## 2015-05-21 LAB — OB RESULTS CONSOLE RPR: RPR: NONREACTIVE

## 2015-05-21 LAB — OB RESULTS CONSOLE HIV ANTIBODY (ROUTINE TESTING): HIV: NONREACTIVE

## 2015-05-21 LAB — OB RESULTS CONSOLE HEPATITIS B SURFACE ANTIGEN: Hepatitis B Surface Ag: NEGATIVE

## 2015-07-14 NOTE — L&D Delivery Note (Signed)
Delivery Note At 11:55 AM a viable and healthy female was delivered via Vaginal, Spontaneous Delivery (Presentation: Left Occiput Anterior).  APGAR: 8, 9; weight pending.   Placenta status: Intact, Spontaneous.  Cord: 3 vessels with the following complications: Loose nuchal cord with true Knot.   Anesthesia: Epidural  Episiotomy: None Lacerations: Periurethral Suture Repair: none Est. Blood Loss (mL): 100  Mom to postpartum.  Baby to Couplet care / Skin to Skin.  Lema Heinkel H. 11/21/2015, 12:13 PM

## 2015-10-28 LAB — OB RESULTS CONSOLE GBS
GBS: NEGATIVE
GBS: NEGATIVE

## 2015-11-15 ENCOUNTER — Other Ambulatory Visit: Payer: Self-pay | Admitting: Obstetrics and Gynecology

## 2015-11-18 ENCOUNTER — Telehealth (HOSPITAL_COMMUNITY): Payer: Self-pay | Admitting: *Deleted

## 2015-11-18 ENCOUNTER — Encounter (HOSPITAL_COMMUNITY): Payer: Self-pay | Admitting: *Deleted

## 2015-11-18 NOTE — Telephone Encounter (Signed)
Preadmission screen  

## 2015-11-20 ENCOUNTER — Inpatient Hospital Stay (HOSPITAL_COMMUNITY)
Admission: AD | Admit: 2015-11-20 | Discharge: 2015-11-22 | DRG: 775 | Disposition: A | Discharge status: Home / Self Care | Payer: 59 | Source: Ambulatory Visit | Attending: Obstetrics and Gynecology | Admitting: Obstetrics and Gynecology

## 2015-11-20 ENCOUNTER — Encounter (HOSPITAL_COMMUNITY): Payer: Self-pay

## 2015-11-20 DIAGNOSIS — Z3490 Encounter for supervision of normal pregnancy, unspecified, unspecified trimester: Secondary | ICD-10-CM

## 2015-11-20 DIAGNOSIS — Z3A39 39 weeks gestation of pregnancy: Secondary | ICD-10-CM | POA: Diagnosis not present

## 2015-11-20 LAB — CBC
HEMATOCRIT: 33.2 % — AB (ref 36.0–46.0)
HEMOGLOBIN: 10.9 g/dL — AB (ref 12.0–15.0)
MCH: 27.7 pg (ref 26.0–34.0)
MCHC: 32.8 g/dL (ref 30.0–36.0)
MCV: 84.5 fL (ref 78.0–100.0)
PLATELETS: 219 10*3/uL (ref 150–400)
RBC: 3.93 MIL/uL (ref 3.87–5.11)
RDW: 13.6 % (ref 11.5–15.5)
WBC: 10.4 10*3/uL (ref 4.0–10.5)

## 2015-11-20 LAB — TYPE AND SCREEN
ABO/RH(D): A POS
Antibody Screen: NEGATIVE

## 2015-11-20 MED ORDER — CITRIC ACID-SODIUM CITRATE 334-500 MG/5ML PO SOLN: 30.0000 mL | ORAL | Status: DC | PRN

## 2015-11-20 MED ORDER — OXYTOCIN 10 UNIT/ML IJ SOLN
2.5000 [IU]/h | INTRAVENOUS | Status: DC
  Administered 2015-11-21: 39.96 [IU]/h via INTRAVENOUS

## 2015-11-20 MED ORDER — LIDOCAINE HCL (PF) 1 % IJ SOLN
30.0000 mL | INTRAMUSCULAR | Status: DC | PRN
  Filled 2015-11-20: qty 30

## 2015-11-20 MED ORDER — TERBUTALINE SULFATE 1 MG/ML IJ SOLN: 0.2500 mg | Freq: Once | INTRAMUSCULAR | Status: DC | PRN

## 2015-11-20 MED ORDER — SODIUM CHLORIDE 0.9% FLUSH: 3.0000 mL | INTRAVENOUS | Status: DC | PRN

## 2015-11-20 MED ORDER — ZOLPIDEM TARTRATE 5 MG PO TABS: 5.0000 mg | ORAL_TABLET | Freq: Every evening | ORAL | Status: DC | PRN

## 2015-11-20 MED ORDER — OXYCODONE-ACETAMINOPHEN 5-325 MG PO TABS: 1.0000 | ORAL_TABLET | ORAL | Status: DC | PRN

## 2015-11-20 MED ORDER — OXYTOCIN 10 UNIT/ML IJ SOLN
1.0000 m[IU]/min | INTRAVENOUS | Status: DC
  Administered 2015-11-21: 2 m[IU]/min via INTRAVENOUS
  Filled 2015-11-20: qty 4

## 2015-11-20 MED ORDER — ONDANSETRON HCL 4 MG/2ML IJ SOLN: 4.0000 mg | Freq: Four times a day (QID) | INTRAMUSCULAR | Status: DC | PRN

## 2015-11-20 MED ORDER — ACETAMINOPHEN 325 MG PO TABS
650.0000 mg | ORAL_TABLET | ORAL | Status: DC | PRN
  Administered 2015-11-21: 650 mg via ORAL
  Filled 2015-11-20: qty 2

## 2015-11-20 MED ORDER — LACTATED RINGERS IV SOLN
500.0000 mL | INTRAVENOUS | Status: DC | PRN
  Administered 2015-11-21: 1000 mL via INTRAVENOUS

## 2015-11-20 MED ORDER — OXYCODONE-ACETAMINOPHEN 5-325 MG PO TABS: 2.0000 | ORAL_TABLET | ORAL | Status: DC | PRN

## 2015-11-20 MED ORDER — FENTANYL CITRATE (PF) 100 MCG/2ML IJ SOLN: 100.0000 ug | INTRAMUSCULAR | Status: DC | PRN

## 2015-11-20 MED ORDER — LACTATED RINGERS IV SOLN
INTRAVENOUS | Status: DC
  Administered 2015-11-20 – 2015-11-21 (×2): via INTRAVENOUS
  Administered 2015-11-21 (×2): 125 mL/h via INTRAVENOUS

## 2015-11-20 MED ORDER — OXYTOCIN BOLUS FROM INFUSION: 500.0000 mL | INTRAVENOUS | Status: DC

## 2015-11-20 NOTE — MAU Note (Signed)
Pt presents with complaint of vaginal bleeding that started about one hour ago, contractions q 10 minutes.

## 2015-11-21 ENCOUNTER — Inpatient Hospital Stay (HOSPITAL_COMMUNITY): Payer: 59 | Admitting: Anesthesiology

## 2015-11-21 ENCOUNTER — Encounter (HOSPITAL_COMMUNITY): Payer: Self-pay | Admitting: Anesthesiology

## 2015-11-21 ENCOUNTER — Inpatient Hospital Stay (HOSPITAL_COMMUNITY): Admission: RE | Admit: 2015-11-21 | Payer: 59 | Source: Ambulatory Visit

## 2015-11-21 LAB — RPR: RPR: NONREACTIVE

## 2015-11-21 LAB — ABO/RH: ABO/RH(D): A POS

## 2015-11-21 MED ORDER — OXYTOCIN 40 UNITS IN LACTATED RINGERS INFUSION - SIMPLE MED: 1.0000 m[IU]/min | INTRAVENOUS | Status: DC

## 2015-11-21 MED ORDER — OXYCODONE-ACETAMINOPHEN 5-325 MG PO TABS: 1.0000 | ORAL_TABLET | ORAL | Status: DC | PRN

## 2015-11-21 MED ORDER — PHENYLEPHRINE 40 MCG/ML (10ML) SYRINGE FOR IV PUSH (FOR BLOOD PRESSURE SUPPORT)
80.0000 ug | PREFILLED_SYRINGE | INTRAVENOUS | Status: DC | PRN
  Administered 2015-11-21: 80 ug via INTRAVENOUS

## 2015-11-21 MED ORDER — PHENYLEPHRINE 40 MCG/ML (10ML) SYRINGE FOR IV PUSH (FOR BLOOD PRESSURE SUPPORT)
80.0000 ug | PREFILLED_SYRINGE | INTRAVENOUS | Status: DC | PRN
  Administered 2015-11-21: 80 ug via INTRAVENOUS
  Filled 2015-11-21: qty 10

## 2015-11-21 MED ORDER — TETANUS-DIPHTH-ACELL PERTUSSIS 5-2.5-18.5 LF-MCG/0.5 IM SUSP: 0.5000 mL | Freq: Once | INTRAMUSCULAR | Status: DC

## 2015-11-21 MED ORDER — FENTANYL 2.5 MCG/ML BUPIVACAINE 1/10 % EPIDURAL INFUSION (WH - ANES)
14.0000 mL/h | INTRAMUSCULAR | Status: DC | PRN
Start: 2015-11-21 — End: 2015-11-21
  Administered 2015-11-21: 14 mL/h via EPIDURAL
  Filled 2015-11-21: qty 125

## 2015-11-21 MED ORDER — SENNOSIDES-DOCUSATE SODIUM 8.6-50 MG PO TABS
2.0000 | ORAL_TABLET | ORAL | Status: DC
  Administered 2015-11-22: 2 via ORAL
  Filled 2015-11-21: qty 2

## 2015-11-21 MED ORDER — ZOLPIDEM TARTRATE 5 MG PO TABS: 5.0000 mg | ORAL_TABLET | Freq: Every evening | ORAL | Status: DC | PRN

## 2015-11-21 MED ORDER — METHYLERGONOVINE MALEATE 0.2 MG PO TABS: 0.2000 mg | ORAL_TABLET | ORAL | Status: DC | PRN

## 2015-11-21 MED ORDER — EPHEDRINE 5 MG/ML INJ: 10.0000 mg | INTRAVENOUS | Status: DC | PRN

## 2015-11-21 MED ORDER — DIBUCAINE 1 % RE OINT: 1.0000 "application " | TOPICAL_OINTMENT | RECTAL | Status: DC | PRN

## 2015-11-21 MED ORDER — COCONUT OIL OIL
1.0000 "application " | TOPICAL_OIL | Status: DC | PRN
  Administered 2015-11-22: 1 via TOPICAL
  Filled 2015-11-21: qty 120

## 2015-11-21 MED ORDER — IBUPROFEN 600 MG PO TABS
600.0000 mg | ORAL_TABLET | Freq: Four times a day (QID) | ORAL | Status: DC
  Administered 2015-11-21 – 2015-11-22 (×4): 600 mg via ORAL
  Filled 2015-11-21 (×4): qty 1

## 2015-11-21 MED ORDER — OXYCODONE-ACETAMINOPHEN 5-325 MG PO TABS: 2.0000 | ORAL_TABLET | ORAL | Status: DC | PRN

## 2015-11-21 MED ORDER — METHYLERGONOVINE MALEATE 0.2 MG/ML IJ SOLN: 0.2000 mg | INTRAMUSCULAR | Status: DC | PRN

## 2015-11-21 MED ORDER — DIPHENHYDRAMINE HCL 50 MG/ML IJ SOLN: 12.5000 mg | INTRAMUSCULAR | Status: DC | PRN

## 2015-11-21 MED ORDER — LACTATED RINGERS IV SOLN: 500.0000 mL | Freq: Once | INTRAVENOUS | Status: DC

## 2015-11-21 MED ORDER — DIPHENHYDRAMINE HCL 25 MG PO CAPS: 25.0000 mg | ORAL_CAPSULE | Freq: Four times a day (QID) | ORAL | Status: DC | PRN

## 2015-11-21 MED ORDER — SIMETHICONE 80 MG PO CHEW: 80.0000 mg | CHEWABLE_TABLET | ORAL | Status: DC | PRN

## 2015-11-21 MED ORDER — LIDOCAINE HCL (PF) 1 % IJ SOLN
INTRAMUSCULAR | Status: DC | PRN
  Administered 2015-11-21: 6 mL via EPIDURAL
  Administered 2015-11-21: 8 mL via EPIDURAL

## 2015-11-21 MED ORDER — OXYTOCIN 40 UNITS IN LACTATED RINGERS INFUSION - SIMPLE MED
2.5000 [IU]/h | INTRAVENOUS | Status: DC
Start: 2015-11-21 — End: 2015-11-22

## 2015-11-21 MED ORDER — PRENATAL MULTIVITAMIN CH
1.0000 | ORAL_TABLET | Freq: Every day | ORAL | Status: DC
  Administered 2015-11-22: 1 via ORAL
  Filled 2015-11-21: qty 1

## 2015-11-21 MED ORDER — ACETAMINOPHEN 325 MG PO TABS: 650.0000 mg | ORAL_TABLET | ORAL | Status: DC | PRN

## 2015-11-21 MED ORDER — WITCH HAZEL-GLYCERIN EX PADS: 1.0000 "application " | MEDICATED_PAD | CUTANEOUS | Status: DC | PRN

## 2015-11-21 MED ORDER — ONDANSETRON HCL 4 MG/2ML IJ SOLN: 4.0000 mg | INTRAMUSCULAR | Status: DC | PRN

## 2015-11-21 MED ORDER — BENZOCAINE-MENTHOL 20-0.5 % EX AERO: 1.0000 "application " | INHALATION_SPRAY | CUTANEOUS | Status: DC | PRN

## 2015-11-21 MED ORDER — ONDANSETRON HCL 4 MG PO TABS: 4.0000 mg | ORAL_TABLET | ORAL | Status: DC | PRN

## 2015-11-21 NOTE — Anesthesia Preprocedure Evaluation (Signed)
Anesthesia Evaluation  Patient identified by MRN, date of birth, ID band Patient awake    Reviewed: Allergy & Precautions, H&P , NPO status , Patient's Chart, lab work & pertinent test results  Airway Mallampati: I  TM Distance: >3 FB Neck ROM: full    Dental no notable dental hx.    Pulmonary neg pulmonary ROS,    Pulmonary exam normal breath sounds clear to auscultation       Cardiovascular negative cardio ROS Normal cardiovascular exam     Neuro/Psych negative neurological ROS  negative psych ROS   GI/Hepatic negative GI ROS, Neg liver ROS,   Endo/Other  negative endocrine ROS  Renal/GU negative Renal ROS     Musculoskeletal   Abdominal Normal abdominal exam  (+)   Peds  Hematology negative hematology ROS (+)   Anesthesia Other Findings   Reproductive/Obstetrics (+) Pregnancy                             Anesthesia Physical Anesthesia Plan  ASA: II  Anesthesia Plan: Epidural   Post-op Pain Management:    Induction:   Airway Management Planned:   Additional Equipment:   Intra-op Plan:   Post-operative Plan:   Informed Consent: I have reviewed the patients History and Physical, chart, labs and discussed the procedure including the risks, benefits and alternatives for the proposed anesthesia with the patient or authorized representative who has indicated his/her understanding and acceptance.     Plan Discussed with:   Anesthesia Plan Comments:         Anesthesia Quick Evaluation  

## 2015-11-21 NOTE — H&P (Signed)
Natasha MeadJessica Trouten is a 30 y.o. female presenting for painful contractions  30 yo [phone] @ 39+0 presents for painful contractions and was found to be in early labor. Her pregnancy has been uncomplicated to this point.  History OB History    Gravida Para Term Preterm AB TAB SAB Ectopic Multiple Living   4 2 2       0 2     Past Medical History  Diagnosis Date  . Medical history non-contributory    Past Surgical History  Procedure Laterality Date  . Tympanostomy tube placement     Family History: family history is not on file. Social History:  reports that she has never smoked. She has never used smokeless tobacco. She reports that she does not drink alcohol or use illicit drugs.   Prenatal Transfer Tool  Maternal Diabetes: No Genetic Screening: Declined Maternal Ultrasounds/Referrals: Normal Fetal Ultrasounds or other Referrals:  None Maternal Substance Abuse:  No Significant Maternal Medications:  None Significant Maternal Lab Results:  None Other Comments:  None  ROS  Dilation: 10 Effacement (%): 100 Station: +2 Exam by:: Dr. Tenny Crawoss Blood pressure 114/74, pulse 81, temperature 98.3 F (36.8 C), temperature source Oral, resp. rate 16, height 5\' 5"  (1.651 m), weight 86.637 kg (191 lb), SpO2 100 %, unknown if currently breastfeeding. Exam Physical Exam  Prenatal labs: ABO, Rh: --/--/A POS, A POS (05/10 2253) Antibody: NEG (05/10 2253) Rubella: Nonimmune (11/08 0000) RPR: Non Reactive (05/10 2253)  HBsAg: Negative (11/08 0000)  HIV: Non-reactive (11/08 0000)  GBS: Negative, Negative (04/17 0000)   Assessment/Plan: 1) Admit 2) Epidural on request 3) Anticipate SVD   Goran Olden H. 11/21/2015, 1:03 PM

## 2015-11-21 NOTE — Lactation Note (Signed)
This note was copied from a baby's chart. Lactation Consultation Note Initial visit at 9 hours.  Mom reports recent feeding of 25 min with some soreness.  Encouraged mom to hand express prior to latching and to call for assist.  Encouraged mom to use pillows for support. [facility] resources given and discussed.  Encouraged to feed with early cues on demand.  Early newborn behavior discussed.  Hand expression reported by mom with colostrum visible.  Mom to call for assist as needed.    Patient Name: Jaime Frank Jaime MeadJessica Cragin [id number]'s Date: 11/21/2015 Reason for consult: Initial assessment   Maternal Data Has patient been taught Hand Expression?: Yes Does the patient have breastfeeding experience prior to this delivery?: Yes  Feeding    LATCH Score/Interventions                Intervention(s): Breastfeeding basics reviewed;Support Pillows     Lactation Tools Discussed/Used     Consult Status Consult Status: Follow-up Date: 11/22/15 Follow-up type: In-patient    Jannifer RodneyShoptaw, Sheryle Vice Lynn 11/21/2015, 9:09 PM

## 2015-11-21 NOTE — Anesthesia Procedure Notes (Signed)
Epidural Patient location during procedure: OB Start time: 11/21/2015 8:00 AM End time: 11/21/2015 8:04 AM  Staffing Anesthesiologist: Leilani AbleHATCHETT, Sophiea Ueda Performed by: anesthesiologist   Preanesthetic Checklist Completed: patient identified, surgical consent, pre-op evaluation, timeout performed, IV checked, risks and benefits discussed and monitors and equipment checked  Epidural Patient position: sitting Prep: site prepped and draped and DuraPrep Patient monitoring: continuous pulse ox and blood pressure Approach: midline Location: L3-L4 Injection technique: LOR air  Needle:  Needle type: Tuohy  Needle gauge: 17 G Needle length: 9 cm and 9 Needle insertion depth: 6 cm Catheter type: closed end flexible Catheter size: 19 Gauge Catheter at skin depth: 11 cm Test dose: negative and Other  Assessment Sensory level: T9 Events: blood not aspirated, injection not painful, no injection resistance, negative IV test and no paresthesia  Additional Notes Reason for block:procedure for pain

## 2015-11-21 NOTE — Discharge Summary (Addendum)
Obstetric Discharge Summary Reason for Admission: onset of labor Prenatal Procedures: none Intrapartum Procedures: spontaneous vaginal delivery Postpartum Procedures: MMR Complications-Operative and Postpartum: periuretrhal laceration HEMOGLOBIN  Date Value Ref Range Status  11/20/2015 10.9* 12.0 - 15.0 g/dL Final   HCT  Date Value Ref Range Status  11/20/2015 33.2* 36.0 - 46.0 % Final     Discharge Diagnoses: Term Pregnancy-delivered  Discharge Information: Date: 11/21/2015 Activity: pelvic rest Diet: routine Medications: Ibuprofen Condition: stable Instructions: refer to practice specific booklet Discharge to: home Follow-up Information    Follow up with Marcial Pacas., MD In 4 weeks.   Specialty:  Obstetrics and Gynecology   Contact information:   [address] [place] [address] [phone]       Newborn Data: Live born female  Birth Weight: 7 lb 12.7 oz (3535 g) APGAR: 8, 9  Home with mother.  Champayne Kocian A 11/21/2015, 10:02 PM

## 2015-11-21 NOTE — Anesthesia Postprocedure Evaluation (Signed)
Anesthesia Post Note  Patient: Jaime Frank  Procedure(s) Performed: * No procedures listed *  Patient location during evaluation: Mother Baby Anesthesia Type: Epidural Level of consciousness: awake Pain management: pain level controlled Vital Signs Assessment: post-procedure vital signs reviewed and stable Respiratory status: spontaneous breathing Cardiovascular status: stable Postop Assessment: no headache, no backache, epidural receding, patient able to bend at knees, no signs of nausea or vomiting and adequate PO intake     Last Vitals:  Filed Vitals:   11/21/15 1419 11/21/15 1546  BP: 108/66 126/70  Pulse: 104 92  Temp: 37 C 36.6 C  Resp: 18     Last Pain:  Filed Vitals:   11/21/15 1616  PainSc: 0-No pain   Pain Goal: Patients Stated Pain Goal: 4 (11/21/15 0825)               Fanny DanceMULLINS,Abimelec Grochowski

## 2015-11-21 NOTE — Anesthesia Pain Management Evaluation Note (Signed)
  [facility] Pain Management Visit Note  Patient: Jaime Frank, 30 y.o., female  "Hello I am a member of the anesthesia team at [facility]. We have an anesthesia team available at all times to provide care throughout the hospital, including epidural management and anesthesia for C-section. I don't know your plan for the delivery whether it a natural birth, water birth, IV sedation, nitrous supplementation, doula or epidural, but we want to meet your pain goals."   1.Was your pain managed to your expectations on prior hospitalizations?   Yes   2.What is your expectation for pain management during this hospitalization?     Epidural  3.How can we help you reach that goal? epidural  Record the patient's initial score and the patient's pain goal.   Pain: 2  Pain Goal: 4 [facility] wants you to be able to say your pain was always managed very well.  Dorrene Bently 11/21/2015

## 2015-11-22 LAB — CBC
HCT: 34.2 % — ABNORMAL LOW (ref 36.0–46.0)
HEMOGLOBIN: 11.4 g/dL — AB (ref 12.0–15.0)
MCH: 28.6 pg (ref 26.0–34.0)
MCHC: 33.3 g/dL (ref 30.0–36.0)
MCV: 85.7 fL (ref 78.0–100.0)
Platelets: 195 10*3/uL (ref 150–400)
RBC: 3.99 MIL/uL (ref 3.87–5.11)
RDW: 13.9 % (ref 11.5–15.5)
WBC: 11.1 10*3/uL — ABNORMAL HIGH (ref 4.0–10.5)

## 2015-11-22 MED ORDER — MEASLES, MUMPS & RUBELLA VAC ~~LOC~~ INJ
0.5000 mL | INJECTION | Freq: Once | SUBCUTANEOUS | Status: AC
  Administered 2015-11-22: 0.5 mL via SUBCUTANEOUS
  Filled 2015-11-22: qty 0.5

## 2015-11-22 NOTE — Lactation Note (Signed)
This note was copied from a baby's chart. Lactation Consultation Note  Patient Name: Boy Natasha MeadJessica Maldonado ZOXWR'UToday's Date: 11/22/2015 Reason for consult: Follow-up assessment Baby at 28 hr of life and mom is reporting bilateral cracked, bleeding nipples. At this time there was no skin break down but nipples appear compressed when baby comes off the breast. Baby has a noticeable lingual frenulum, heart shaped tongue tip, and tends to bunch tongue in the back of the mouth. Baby does have good lateralization, can lift tongue to midline, can extend tongue over gum ridge, and has good peristolic tongue movement. Baby can maintain proper latch for short bursts of sucking. Mom is dangling breast over baby because she is worried he can not breath when she pulls him close. Had mom move baby to a cross cradle position and showed her how to look for swallows. Reviewed nipple care and discussed with RN giving coconut oil. Mom seemed irritated at this visit and stated several times she is ready to get home. Offered OP apt and she declined. She stated "if this (bf) keeps stressing me out I will switch to bottles until my milk comes in".  She does have lactation handouts and is aware of OP services along with support group if she changes her mind.   Maternal Data    Feeding Feeding Type: Breast Fed  LATCH Score/Interventions Latch: Repeated attempts needed to sustain latch, nipple held in mouth throughout feeding, stimulation needed to elicit sucking reflex. Intervention(s): Adjust position;Assist with latch;Breast compression  Audible Swallowing: A few with stimulation Intervention(s): Hand expression;Skin to skin Intervention(s): Alternate breast massage  Type of Nipple: Everted at rest and after stimulation  Comfort (Breast/Nipple): Filling, red/small blisters or bruises, mild/mod discomfort  Problem noted: Mild/Moderate discomfort Interventions (Mild/moderate discomfort): Hand expression  Hold  (Positioning): Assistance needed to correctly position infant at breast and maintain latch. Intervention(s): Position options;Support Pillows  LATCH Score: 6  Lactation Tools Discussed/Used     Consult Status Consult Status: Follow-up Follow-up type: Out-patient    Rulon Eisenmengerlizabeth E Deshunda Thackston 11/22/2015, 4:22 PM

## 2018-07-15 ENCOUNTER — Ambulatory Visit (HOSPITAL_COMMUNITY): Payer: BLUE CROSS/BLUE SHIELD

## 2018-07-15 ENCOUNTER — Ambulatory Visit (HOSPITAL_COMMUNITY)
Admission: EM | Admit: 2018-07-15 | Discharge: 2018-07-15 | Disposition: A | Discharge status: Home / Self Care | Payer: BLUE CROSS/BLUE SHIELD | Attending: Family Medicine | Admitting: Family Medicine

## 2018-07-15 ENCOUNTER — Encounter (HOSPITAL_COMMUNITY): Payer: Self-pay

## 2018-07-15 ENCOUNTER — Ambulatory Visit (INDEPENDENT_AMBULATORY_CARE_PROVIDER_SITE_OTHER): Payer: BLUE CROSS/BLUE SHIELD

## 2018-07-15 ENCOUNTER — Other Ambulatory Visit: Payer: Self-pay

## 2018-07-15 DIAGNOSIS — S52509A Unspecified fracture of the lower end of unspecified radius, initial encounter for closed fracture: Secondary | ICD-10-CM | POA: Diagnosis not present

## 2018-07-15 DIAGNOSIS — S8000XA Contusion of unspecified knee, initial encounter: Secondary | ICD-10-CM | POA: Diagnosis not present

## 2018-07-15 DIAGNOSIS — S52514A Nondisplaced fracture of right radial styloid process, initial encounter for closed fracture: Secondary | ICD-10-CM

## 2018-07-15 MED ORDER — HYDROCODONE-ACETAMINOPHEN 5-325 MG PO TABS
1.0000 | ORAL_TABLET | Freq: Once | ORAL | Status: AC
  Administered 2018-07-15: 1 via ORAL

## 2018-07-15 MED ORDER — HYDROCODONE-ACETAMINOPHEN 5-325 MG PO TABS: 1.0000 | ORAL_TABLET | ORAL | 0 refills | Status: AC | PRN

## 2018-07-15 MED ORDER — HYDROCODONE-ACETAMINOPHEN 5-325 MG PO TABS
ORAL_TABLET | ORAL | Status: AC
Start: 2018-07-15 — End: ?
  Filled 2018-07-15: qty 1

## 2018-07-15 MED ORDER — IBUPROFEN 800 MG PO TABS: 800.0000 mg | ORAL_TABLET | Freq: Three times a day (TID) | ORAL | 0 refills | Status: AC
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
## 2018-07-15 NOTE — ED Notes (Signed)
Ortho tech at bedside 

## 2018-07-15 NOTE — Discharge Instructions (Addendum)
Ice Elevate Leave splint intact Call orthopedic to set up appointment for next week Ibuprofen 3 x a day with food May take the hydrocodone if needed Dr Ophelia Charter is on call.  He is an excellent orthopedic, call today to be seen next week

## 2018-07-15 NOTE — ED Notes (Signed)
Ortho tech made aware of needing for splinting.

## 2018-07-15 NOTE — ED Notes (Signed)
Patient able to ambulate independently  

## 2018-07-18 ENCOUNTER — Emergency Department: Payer: BLUE CROSS/BLUE SHIELD

## 2018-07-18 ENCOUNTER — Encounter: Payer: Self-pay | Admitting: Emergency Medicine

## 2018-07-18 ENCOUNTER — Emergency Department
Admission: EM | Admit: 2018-07-18 | Discharge: 2018-07-18 | Disposition: A | Discharge status: Home / Self Care | Payer: BLUE CROSS/BLUE SHIELD | Source: Home / Self Care | Attending: Emergency Medicine | Admitting: Emergency Medicine

## 2018-07-18 ENCOUNTER — Emergency Department (INDEPENDENT_AMBULATORY_CARE_PROVIDER_SITE_OTHER): Payer: BLUE CROSS/BLUE SHIELD

## 2018-07-18 ENCOUNTER — Other Ambulatory Visit: Payer: Self-pay

## 2018-07-18 DIAGNOSIS — S20219A Contusion of unspecified front wall of thorax, initial encounter: Secondary | ICD-10-CM | POA: Diagnosis not present

## 2018-07-18 DIAGNOSIS — R079 Chest pain, unspecified: Secondary | ICD-10-CM | POA: Diagnosis not present
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
## 2022-03-25 ENCOUNTER — Ambulatory Visit: Payer: BLUE CROSS/BLUE SHIELD | Admitting: Physician Assistant
